# Patient Record
Sex: Female | Born: 1990 | Race: Black or African American | Hispanic: No | Marital: Single | Smoking: Never smoker
Health system: Southern US, Community
[De-identification: ages and names within clinical notes are randomized; demographics above are authoritative.]

---

## 2015-03-14 ENCOUNTER — Emergency Department (HOSPITAL_COMMUNITY): Payer: Self-pay

## 2015-03-14 ENCOUNTER — Encounter (HOSPITAL_COMMUNITY): Payer: Self-pay | Admitting: *Deleted

## 2015-03-14 ENCOUNTER — Emergency Department (HOSPITAL_COMMUNITY)
Admission: EM | Admit: 2015-03-14 | Discharge: 2015-03-14 | Disposition: A | Discharge status: Home / Self Care | Payer: Self-pay | Attending: Emergency Medicine | Admitting: Emergency Medicine

## 2015-03-14 DIAGNOSIS — M25511 Pain in right shoulder: Secondary | ICD-10-CM | POA: Insufficient documentation

## 2015-03-14 MED ORDER — NAPROXEN 250 MG PO TABS
500.0000 mg | ORAL_TABLET | Freq: Once | ORAL | Status: AC
  Administered 2015-03-14: 500 mg via ORAL
  Filled 2015-03-14: qty 2

## 2015-03-14 MED ORDER — NAPROXEN 500 MG PO TABS: 500.0000 mg | ORAL_TABLET | Freq: Two times a day (BID) | ORAL | Status: AC

## 2015-03-14 NOTE — Discharge Instructions (Signed)
Shoulder Pain °The shoulder is the joint that connects your arms to your body. The bones that form the shoulder joint include the upper arm bone (humerus), the shoulder blade (scapula), and the collarbone (clavicle). The top of the humerus is shaped like a ball and fits into a rather flat socket on the scapula (glenoid cavity). A combination of muscles and strong, fibrous tissues that connect muscles to bones (tendons) support your shoulder joint and hold the ball in the socket. Small, fluid-filled sacs (bursae) are located in different areas of the joint. They act as cushions between the bones and the overlying soft tissues and help reduce friction between the gliding tendons and the bone as you move your arm. Your shoulder joint allows a wide range of motion in your arm. This range of motion allows you to do things like scratch your back or throw a ball. However, this range of motion also makes your shoulder more prone to pain from overuse and injury. °Causes of shoulder pain can originate from both injury and overuse and usually can be grouped in the following four categories: °· Redness, swelling, and pain (inflammation) of the tendon (tendinitis) or the bursae (bursitis). °· Instability, such as a dislocation of the joint. °· Inflammation of the joint (arthritis). °· Broken bone (fracture). °HOME CARE INSTRUCTIONS  °· Apply ice to the sore area. °¨ Put ice in a plastic bag. °¨ Place a towel between your skin and the bag. °¨ Leave the ice on for 15-20 minutes, 3-4 times per day for the first 2 days, or as directed by your health care provider. °· Stop using cold packs if they do not help with the pain. °· If you have a shoulder sling or immobilizer, wear it as long as your caregiver instructs. Only remove it to shower or bathe. Move your arm as little as possible, but keep your hand moving to prevent swelling. °· Squeeze a soft ball or foam pad as much as possible to help prevent swelling. °· Only take  over-the-counter or prescription medicines for pain, discomfort, or fever as directed by your caregiver. °SEEK MEDICAL CARE IF:  °· Your shoulder pain increases, or new pain develops in your arm, hand, or fingers. °· Your hand or fingers become cold and numb. °· Your pain is not relieved with medicines. °SEEK IMMEDIATE MEDICAL CARE IF:  °· Your arm, hand, or fingers are numb or tingling. °· Your arm, hand, or fingers are significantly swollen or turn white or blue. °MAKE SURE YOU:  °· Understand these instructions. °· Will watch your condition. °· Will get help right away if you are not doing well or get worse. °  °This information is not intended to replace advice given to you by your health care provider. Make sure you discuss any questions you have with your health care provider. °  °Document Released: 02/05/2005 Document Revised: 05/19/2014 Document Reviewed: 08/21/2014 °Elsevier Interactive Patient Education ©2016 Elsevier Inc. ° ° °Emergency Department Resource Guide °1) Find a Doctor and Pay Out of Pocket °Although you won't have to find out who is covered by your insurance plan, it is a good idea to ask around and get recommendations. You will then need to call the office and see if the doctor you have chosen will accept you as a new patient and what types of options they offer for patients who are self-pay. Some doctors offer discounts or will set up payment plans for their patients who do not have insurance, but you   need to ask so you aren't surprised when you get to your appointment.  2) Contact Your Local Health Department Not all health departments have doctors that can see patients for sick visits, but many do, so it is worth a call to see if yours does. If you don't know where your local health department is, you can check in your phone book. The CDC also has a tool to help you locate your state's health department, and many state websites also have listings of all of their local health  departments.  3) Find a Walk-in Clinic If your illness is not likely to be very severe or complicated, you may want to try a walk in clinic. These are popping up all over the country in pharmacies, drugstores, and shopping centers. They're usually staffed by nurse practitioners or physician assistants that have been trained to treat common illnesses and complaints. They're usually fairly quick and inexpensive. However, if you have serious medical issues or chronic medical problems, these are probably not your best option.  No Primary Care Doctor: - Call Health Connect at  [phone] - they can help you locate a primary care doctor that  accepts your insurance, provides certain services, etc. - Physician Referral Service- [phone]  Chronic Pain Problems: Organization         Address  Phone   Notes  [facility]  [phone] Patients need to be referred by their primary care doctor.   Medication Assistance: Organization         Address  Phone   Notes  [facility] Medication [facility] [address] [place] [place], [place] [address] [phone] --Must be a resident of [facility] -- Must have NO insurance coverage whatsoever (no Medicaid/ Medicare, etc.) -- The pt. MUST have a primary care doctor that directs their care regularly and follows them in the community   [facility]  [phone]   [employer]  [phone]    Agencies that provide inexpensive medical care: Organization         Address  Phone   Notes  [facility]  [phone]   [facility]    [phone]   [facility] [address] [place], [place] [address] [phone]   [facility] [address] [place] [place] [phone]   Planned Parenthood    [phone]   [facility]    [phone]   Community Health and [facility]  [address], [place] Phone:  [phone], Fax:  [phone] Hours of Operation:  9 am - 6 pm, M-F.  Also accepts Medicaid/Medicare and self-pay.  [facility]  [address], [place] Phone: [phone], Fax: [phone]. Hours of Operation:  8:30 am - 5:30 pm, M-F.  Also accepts Medicaid and self-pay.  [facility] [address], [place] Phone: [phone]   [facility] [address] Natasha Bence [place], [place] [phone], Ext. [phone] Mondays & Thursdays: 7-9 AM.  First 15 patients are seen on a first come, first serve basis.    Medicaid-accepting [facility] Providers:  Organization         Address  Phone   Notes  [facility] [address], [place] [phone] Also accepts self-pay patients.  [facility] [address] [place], [address]  (  [place]) [place], [address], [place] [phone]   [place] [address], [place] [phone]   Lucianne Lei [address], [place]   [phone] Only accepts [place] Access [place] patients after they have their name applied to their card.   Self-Pay (no insurance) in [facility]:  Organization         Address  Phone   Notes  Sickle Cell Patients, [facility] [place] [phone]   [facility] Urgent Care [place] [phone]   [facility] [place]  [place], [place], [place] [phone]   [facility]/Dr. Osei-Bonsu  [address], [place] or [address], [place] [phone] Phone number for both [place] and [place] locations is the same.  Urgent Medical and [facility] [address] [place] [phone]   [facility] [address], [place] or [address] [phone]   [facility] [address] [place] [phone], phone; [phone], fax Sees patients 1st and 3rd Saturday of every month.  Must not qualify for public or private insurance (i.e. Medicaid, Medicare, [place] Health Choice, Veterans' Benefits)  Household income should be no more than 200% of the poverty level The clinic cannot treat you if you are pregnant or think you are pregnant  Sexually transmitted diseases are not treated at the clinic.    Dental Care: Organization         Address  Phone  Notes  [facility] Department of [facility] [place] [phone] Accepts children up to age 71 who are enrolled in [place] or [place]; pregnant women with a Medicaid card; and children who have applied for Medicaid or [place] Health Choice, but were declined, whose parents can pay a reduced fee at time of service.  [facility] Department of [facility]  [address], [place] [phone] Accepts children up to age 69 who are enrolled in [place] or [place]; pregnant women with a Medicaid card; and children who have applied for Medicaid or [place] Health Choice, but were declined, whose parents can pay a reduced fee at time of service.  [place] [facility] PRO[facility]  [place] [phone] Patients are seen by appointment only. Walk-ins are not accepted. [place] will see patients 79 years of age and older. Monday - Tuesday (8am-5pm) Most Wednesdays (8:30-5pm) $30 per visit, cash only  [facility] Adult Dental Access PROGRAM  [address], [facility] [phone] Patients are seen by appointment only. Walk-ins are not accepted. [place] will see patients 35 years of age and older. One Wednesday Evening (Monthly: Volunteer Based).  $30 per visit, cash only  [place]  [phone] for adults;  Children under age 61, call Graduate Pediatric Dentistry at [phone]. Children aged 61-14, please call [phone] to request a pediatric application.  Dental services are provided in all areas of dental care including fillings, crowns and bridges, complete and partial dentures, implants, gum treatment, root canals, and extractions. Preventive care is also provided. Treatment is provided to both adults and children. Patients are selected via a lottery and there is  often a waiting list.   [facility] [address] [place]  [phone] [email or web address]   [facility] [address] [place], [place] [phone], Ext. [phone] Second and Fourth Thursday of each month, opens at 6:30 AM; Clinic ends at 9 AM.  Patients are seen on a first-come first-served basis, and a limited number are seen during each clinic.   [facility]  [address] Hillard Danker [place], [place] [phone]   Eligibility Requirements You must have lived in [place], [place], or [place] counties for at least the last three months.   You cannot be eligible for state or federal sponsored [employer], including [employer], [place], or [employer].   You generally cannot be eligible for healthcare insurance through your employer.    How to apply: Eligibility screenings are held every Tuesday and Wednesday afternoon from 1:00 pm until 4:00 pm. You do not need an appointment for the interview!  [facility] [address] [place], [place]   [place]  [place] Department  [place]  [phone]    Behavioral Health Resources in the Community: Intensive Outpatient Programs Organization         Address  Phone  Notes  [place]. [address], [place], [place] [phone]   [facility] Outpatient [address], [place], [place]   [facility]: [facility] [address] [place], [place]   [place] [address]  [place], [place] or [phone]   Substance Abuse Resources Organization         Address  Phone  Notes  Al[facility] Drug Services  [phone]   [place]  [phone]   [facility] [place]   Chinita Pester  [phone]   Residential & Outpatient Substance Abuse Program  [phone]   Psychological Services Organization         Address  Phone  Notes  [facility] [place]  [place]  [phone]   [place] [address] [place] or [phone]    Mobile Crisis Teams Organization         Address  Phone  Notes  [facility], Mobile Crisis Care Unit  [phone]   [facility]  [address] [place], [place]   [place] Levels [address], [place] [phone]    Self-Help/Support Groups Organization         Address  Phone             Notes  [place]. of [place] - variety of support groups  [place] Call for more information  Narcotics Anonymous (NA), [facility] [address], [employer] [place]  2 meetings at this location   Special educational needs teacher         Address  Phone  Notes  [facility] [place],    [place]  [phone]   [facility]  [address], [place] [phone], [place], [place]   [place], [place] [phone] Admissions: 8am-3pm M-F  Incentives Substance [place] [address]    [place], [place]   [facility] [address] [place], [place], [place]  [facility] [address],  [place], [place] [phone]   [facility] - Intensive  Outpatient [address] Laurell [address], [place], [place] [phone]   [facility] ([facility] [address] [place].,  [place], [place] [phone] or [phone]   [facility] ([facility]) [address] [place], [place] [phone] Accepts Medicaid  [facility] [place] [address]  [place] [phone] Substance Abuse/Addiction Treatment   [facility] Organization         Address  Phone  Notes  [facility]  [phone]   Angie Fava, PhD [address] Ervin Knack [place], [place]   [phone] or [phone]   [facility] Behavioral   [address] [place], [place] [phone]   [facility] [address] [place], [place] [phone] Insurance/Medicaid/sponsorship through [facility] and Families [address]                                    [place], [place] [phone] Therapy/tele-psych/case  [facility] [address], [place] [phone]    Dr. Lolly Mustache  [phone]   [facility] [place]  [employer] [facility] Dept. 1) [address] [place] 2) [address], [place] 3)  [address], [place] [phone]  [phone]   [facility] Child Abuse Hotline [phone] or [phone] (After Hours)

## 2015-03-14 NOTE — ED Provider Notes (Signed)
CSN: [id number]     Arrival date & time 03/14/15  1926 History   By signing my name below, I, Evon Slackerrance Branch, attest that this documentation has been prepared under the direction and in the presence of [employer] Patel-Mills, PA-C. Electronically Signed: Evon Slackerrance Branch, ED Scribe. 03/14/2015. 10:43 PM.     Chief Complaint  Patient presents with  . Shoulder Pain   The history is provided by the patient. No language interpreter was used.   HPI Comments: Rachel Villanueva is a 24 y.o. female who presents to the Emergency Department complaining of gradually worsening stabbing right shoulder pain that began yesterday. Pt doesn't report any associated symptoms. Pt states that the pain is worse with certain movements. Pt states she has tried Advil that provided temporary relief. Pt states that she has tried heating pad with no relief. Pt denies injury or fall. Pt doesn't report numbness or tingling.   History reviewed. No pertinent past medical history. History reviewed. No pertinent past surgical history. No family history on file. Social History  Substance Use Topics  . Smoking status: Never Smoker   . Smokeless tobacco: None  . Alcohol Use: Yes   OB History    No data available     Review of Systems  Musculoskeletal: Positive for arthralgias.  Neurological: Negative for numbness.      Allergies  Review of patient's allergies indicates no known allergies.  Home Medications   Prior to Admission medications   Medication Sig Start Date End Date Taking? Authorizing Provider  naproxen (NAPROSYN) 500 MG tablet Take 1 tablet (500 mg total) by mouth 2 (two) times daily. 03/14/15   Spike Desilets Patel-Mills, PA-C   BP 120/75 mmHg  Pulse 64  Temp(Src) 98.2 F (36.8 C) (Oral)  Resp 18  Ht 5\' 6"  (1.676 m)  Wt 143 lb 3 oz (64.949 kg)  BMI 23.12 kg/m2  SpO2 99%  LMP 03/14/2015   Physical Exam  Constitutional: She is oriented to person, place, and time. She appears well-developed and  well-nourished. No distress.  HENT:  Head: Normocephalic and atraumatic.  Eyes: Conjunctivae and EOM are normal.  Neck: Neck supple. No tracheal deviation present.  Cardiovascular: Normal rate.   Pulmonary/Chest: Effort normal. No respiratory distress.  Musculoskeletal: Normal range of motion.  Full passive ROM of right arm and shoulder. No pain with flexion or extension of the elbow, no reproducible tenderness, no swelling or erythema, 2+ radial pulse. No clavicle deformity or pain.  Neurological: She is alert and oriented to person, place, and time.  Skin: Skin is warm and dry.  Psychiatric: She has a normal mood and affect. Her behavior is normal.  Nursing note and vitals reviewed.   ED Course  Procedures (including critical care time) DIAGNOSTIC STUDIES: Oxygen Saturation is 98% on RA, normal by my interpretation.    COORDINATION OF CARE: 8:20 PM-Discussed treatment plan with pt at bedside and pt agreed to plan.     Labs Review Labs Reviewed - No data to display  Imaging Review Dg Humerus Right  03/14/2015  CLINICAL DATA:  Pain in the region of the humerus EXAM: RIGHT HUMERUS - 2+ VIEW COMPARISON:  None. FINDINGS: No acute fracture. No dislocation. Unremarkable soft tissues. No destructive bone lesion. No periosteum reaction. IMPRESSION: No acute bony pathology. Electronically Signed   By: Jolaine ClickArthur  Hoss M.D.   On: 03/14/2015 21:15      EKG Interpretation None      MDM   Final diagnoses:  Right shoulder pain  Patient presents for right shoulder/humerus pain. She denies any fall or injury. She states it started out with blue. Her vital signs are stable. I x-rayed the humerus to look for any bony pathology or lesion but this was negative. This is most likely a musculoskeletal strain since it is worse with certain movements. I discussed follow-up with the patient and gave her referral. Plan that she can take naproxen for pain. Medications  naproxen (NAPROSYN) tablet 500 mg  (500 mg Oral Given 03/14/15 2047)   I personally performed the services described in this documentation, which was scribed in my presence. The recorded information has been reviewed and is accurate.      Catha Gosselin, PA-C 03/14/15 2243  Lavera Guise, MD 03/16/15 1126

## 2015-03-14 NOTE — ED Notes (Signed)
The pt is c/o pain in her rt humerus for 2 days.  No known injury no previous history  lmp now

## 2015-07-09 ENCOUNTER — Ambulatory Visit (INDEPENDENT_AMBULATORY_CARE_PROVIDER_SITE_OTHER): Payer: Self-pay | Admitting: *Deleted

## 2015-07-09 ENCOUNTER — Encounter: Payer: Self-pay | Admitting: *Deleted

## 2015-07-09 DIAGNOSIS — Z3490 Encounter for supervision of normal pregnancy, unspecified, unspecified trimester: Secondary | ICD-10-CM

## 2015-07-09 DIAGNOSIS — Z3201 Encounter for pregnancy test, result positive: Secondary | ICD-10-CM

## 2015-07-09 LAB — POCT PREGNANCY, URINE: Preg Test, Ur: POSITIVE — AB

## 2015-07-09 NOTE — Progress Notes (Signed)
Pregnancy test positive. Informed patient, discussed starting prenatal care. Advised to start with [facility], phone number given. Patient voiced understanding.

## 2017-06-26 IMAGING — CR DG HUMERUS 2V *R*
2 series · 2 of 2 positions shown · non-contrast
Comparison: None.

CLINICAL DATA: Pain in the region of the humerus

EXAM:
RIGHT HUMERUS - 2+ VIEW

[humerus ap]
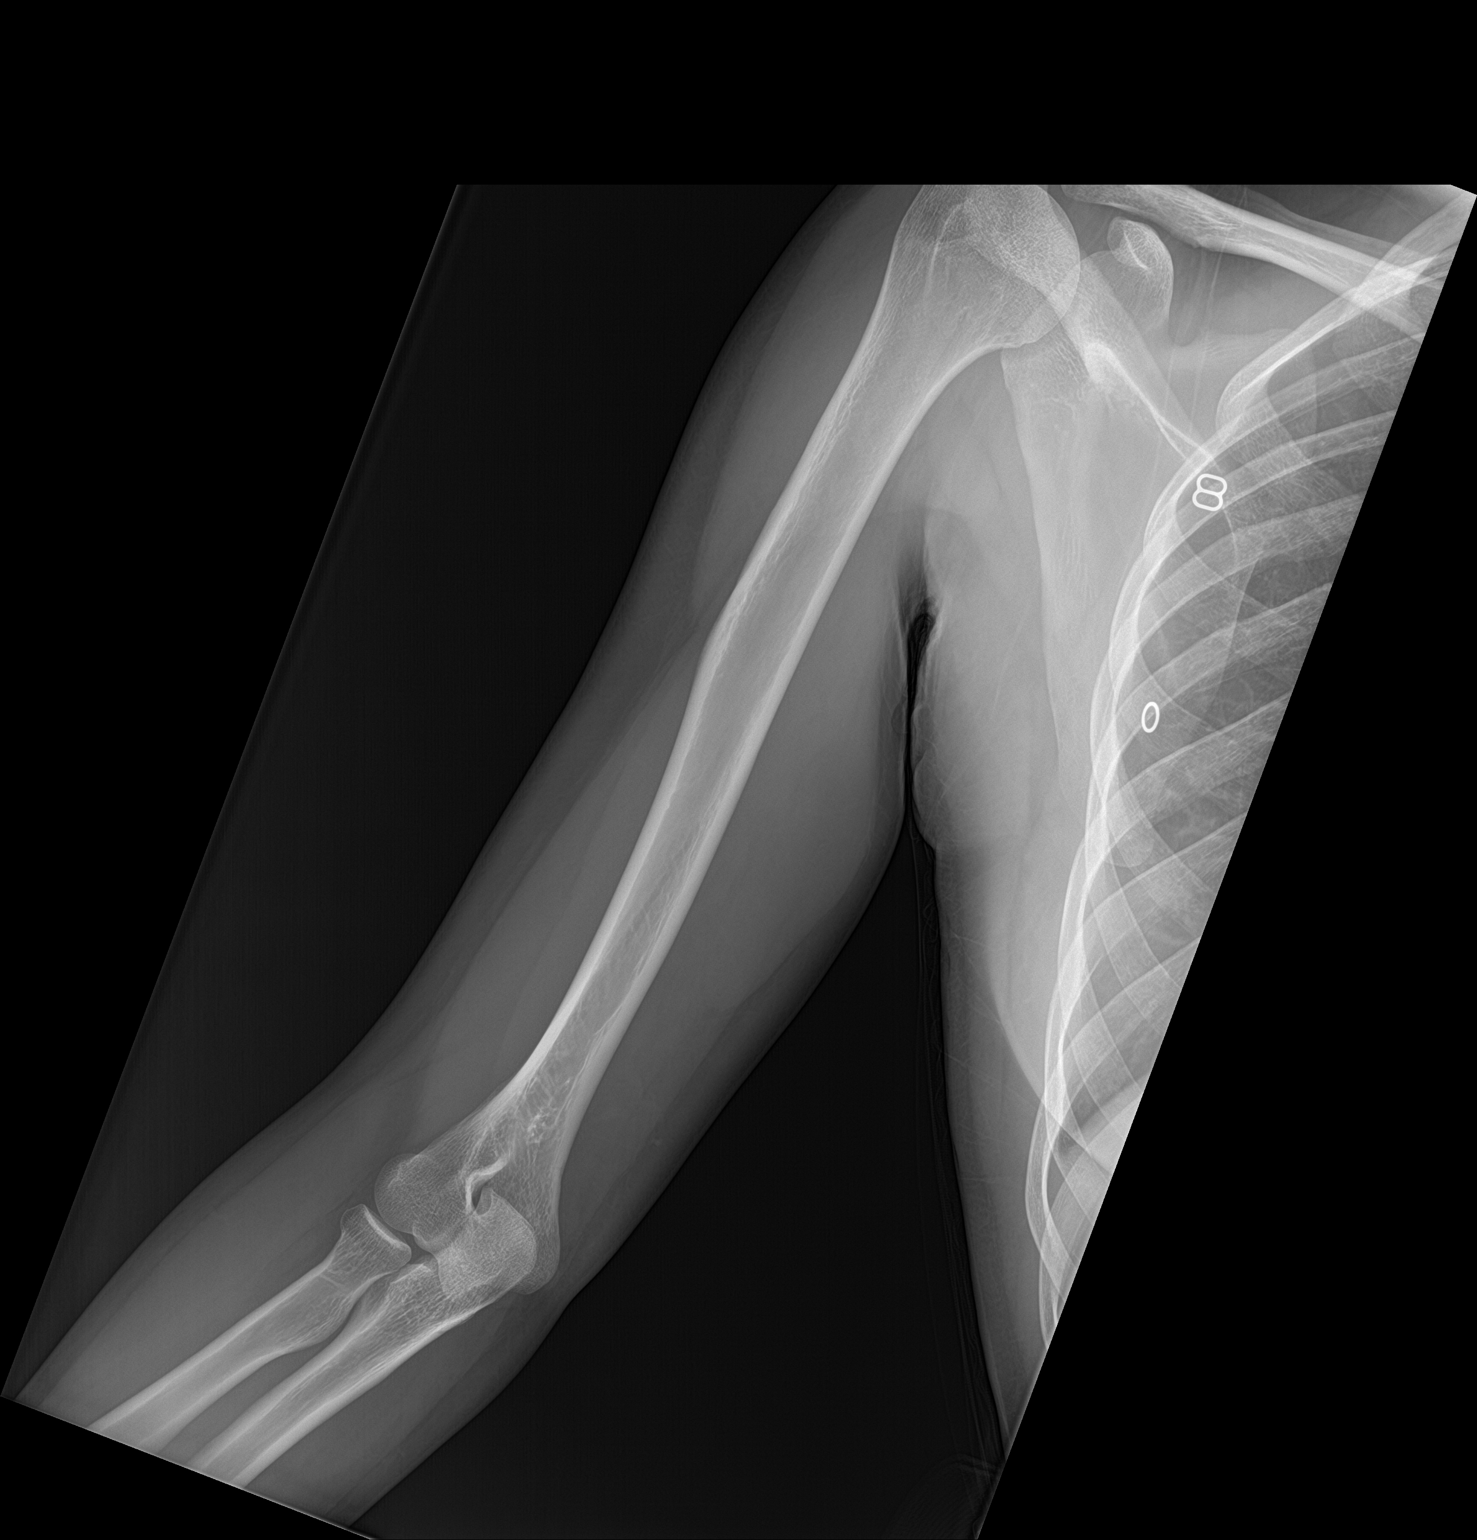

[humerus lat]
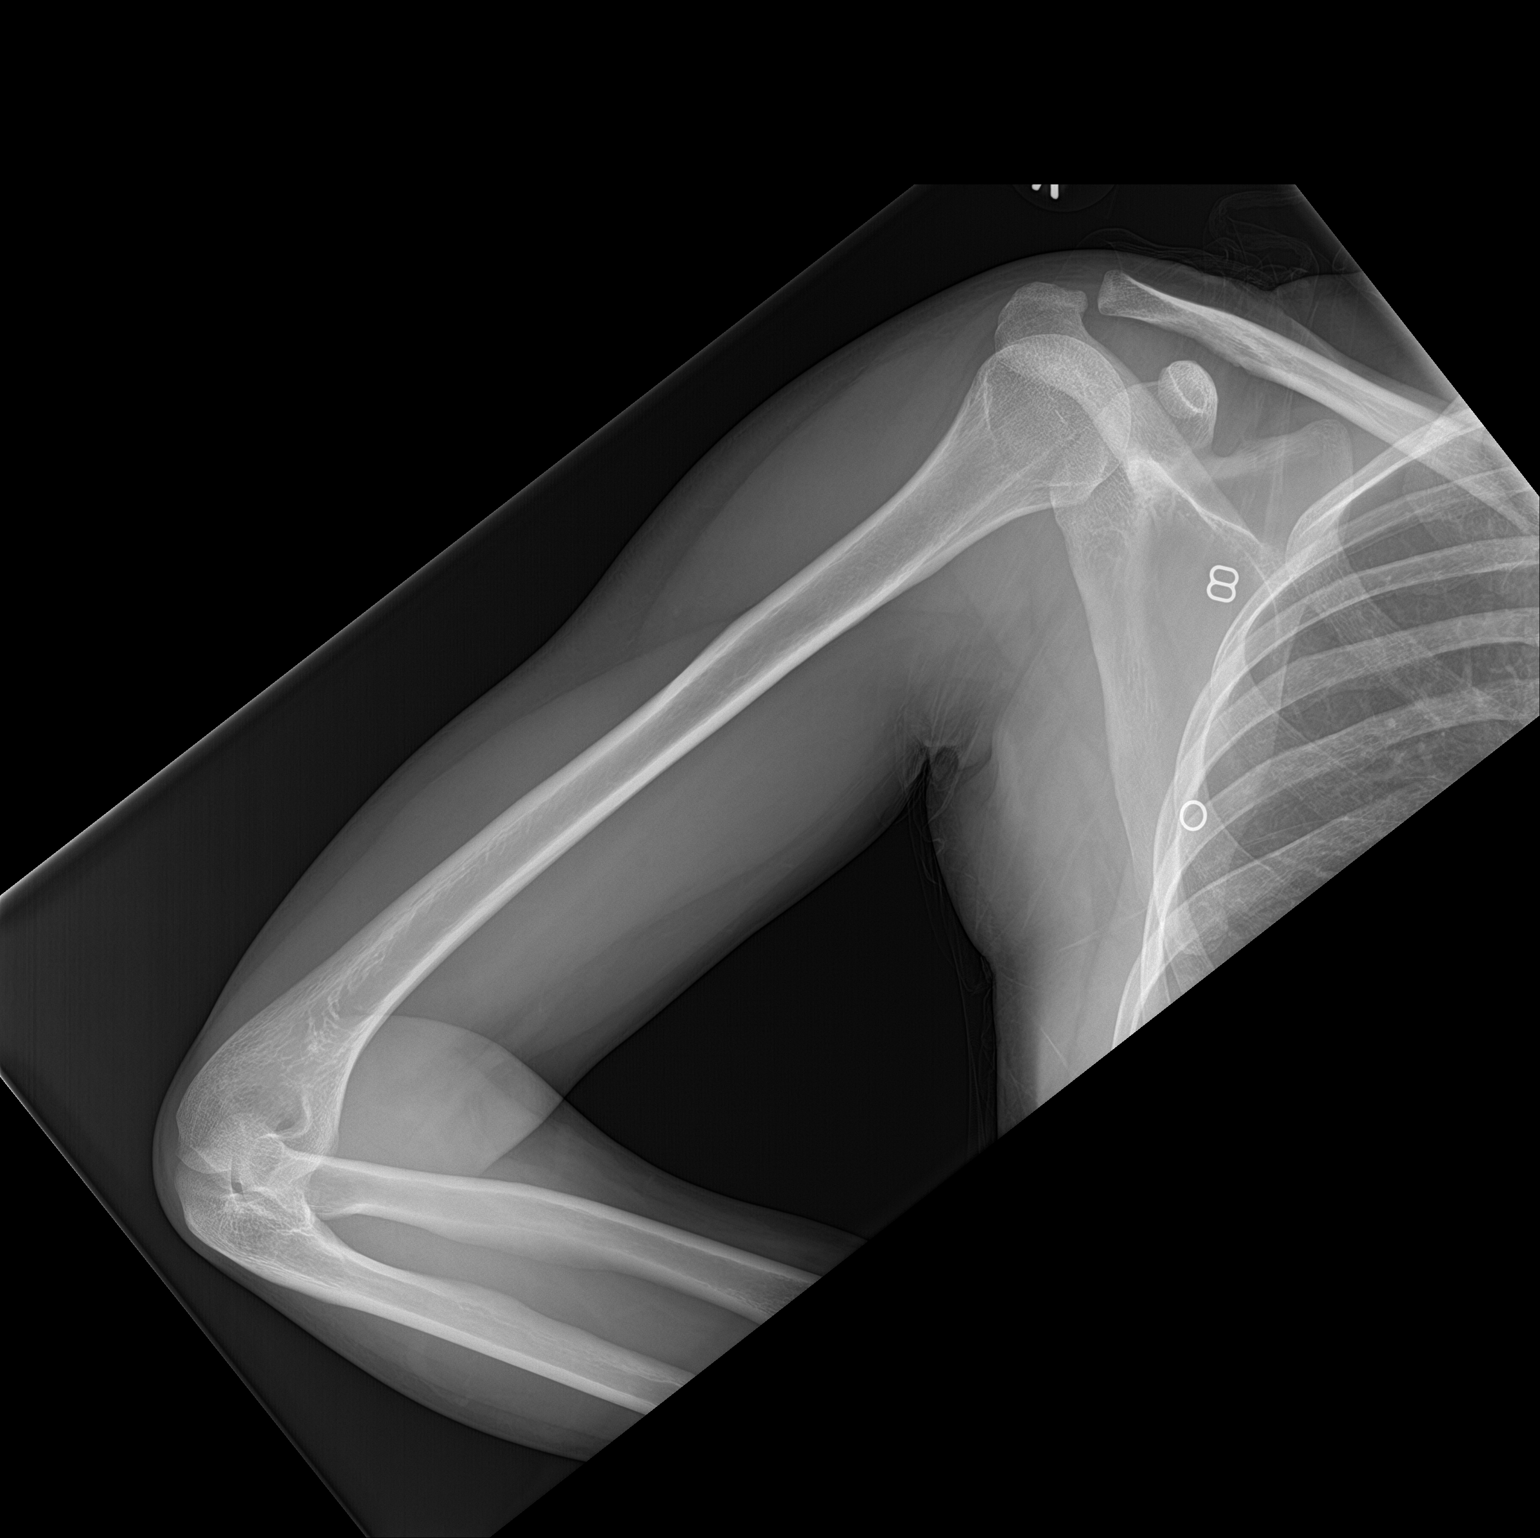

[2 of 2 positions shown; findings below may reference images not displayed]

FINDINGS: No acute fracture. No dislocation. Unremarkable soft tissues. No
destructive bone lesion. No periosteum reaction.
IMPRESSION: No acute bony pathology.
